# Patient Record
Sex: Female | Born: 1950 | Race: White | Hispanic: No | Marital: Single | Smoking: Never smoker
Health system: Southern US, Community
[De-identification: ages and names within clinical notes are randomized; demographics above are authoritative.]

## PROBLEM LIST (undated history)

## (undated) DIAGNOSIS — I10 Essential (primary) hypertension: Secondary | ICD-10-CM

## (undated) DIAGNOSIS — F329 Major depressive disorder, single episode, unspecified: Secondary | ICD-10-CM

## (undated) DIAGNOSIS — F32A Depression, unspecified: Secondary | ICD-10-CM

## (undated) DIAGNOSIS — F418 Other specified anxiety disorders: Secondary | ICD-10-CM

## (undated) DIAGNOSIS — F419 Anxiety disorder, unspecified: Secondary | ICD-10-CM

## (undated) DIAGNOSIS — I341 Nonrheumatic mitral (valve) prolapse: Secondary | ICD-10-CM

## (undated) DIAGNOSIS — E7800 Pure hypercholesterolemia, unspecified: Secondary | ICD-10-CM

## (undated) HISTORY — PX: BLADDER SUSPENSION: SHX72

---

## 2009-02-19 ENCOUNTER — Emergency Department (HOSPITAL_BASED_OUTPATIENT_CLINIC_OR_DEPARTMENT_OTHER): Admission: EM | Admit: 2009-02-19 | Discharge: 2009-02-19 | Payer: Self-pay | Admitting: Emergency Medicine

## 2009-05-28 ENCOUNTER — Ambulatory Visit: Payer: Self-pay | Admitting: Diagnostic Radiology

## 2009-05-28 ENCOUNTER — Emergency Department (HOSPITAL_BASED_OUTPATIENT_CLINIC_OR_DEPARTMENT_OTHER): Admission: EM | Admit: 2009-05-28 | Discharge: 2009-05-28 | Payer: Self-pay | Admitting: Emergency Medicine

## 2010-11-19 LAB — POCT CARDIAC MARKERS
CKMB, poc: <1 ng/mL — ABNORMAL LOW (ref 1.0–8.0)
Myoglobin, poc: 48.1 ng/mL (ref 12–200)
Troponin i, poc: <0.05 ng/mL (ref 0.00–0.09)
Troponin i, poc: <0.05 ng/mL (ref 0.00–0.09)

## 2010-11-19 LAB — BASIC METABOLIC PANEL
BUN: 11 mg/dL (ref 6–23)
CO2: 27 mEq/L (ref 19–32)
Calcium: 9.1 mg/dL (ref 8.4–10.5)
Chloride: 106 mEq/L (ref 96–112)
GFR calc Af Amer: >60 mL/min (ref >60)
Potassium: 3.7 mEq/L (ref 3.5–5.1)
Sodium: 140 mEq/L (ref 135–145)

## 2010-11-19 LAB — DIFFERENTIAL
Lymphocytes Relative: 39 % (ref 12–46)
Monocytes Absolute: 0.4 10*3/uL (ref 0.1–1.0)

## 2010-11-19 LAB — CBC
Hemoglobin: 13.3 g/dL (ref 12.0–15.0)
RBC: 4.23 MIL/uL (ref 3.87–5.11)
RDW: 11.8 % (ref 11.5–15.5)
WBC: 5.4 10*3/uL (ref 4.0–10.5)

## 2013-12-19 ENCOUNTER — Encounter (HOSPITAL_BASED_OUTPATIENT_CLINIC_OR_DEPARTMENT_OTHER): Payer: Self-pay | Admitting: Emergency Medicine

## 2013-12-19 ENCOUNTER — Emergency Department (HOSPITAL_BASED_OUTPATIENT_CLINIC_OR_DEPARTMENT_OTHER)
Admission: EM | Admit: 2013-12-19 | Discharge: 2013-12-19 | Disposition: A | Discharge status: Home / Self Care | Payer: 59 | Attending: Emergency Medicine | Admitting: Emergency Medicine

## 2013-12-19 ENCOUNTER — Emergency Department (HOSPITAL_BASED_OUTPATIENT_CLINIC_OR_DEPARTMENT_OTHER): Payer: 59

## 2013-12-19 ENCOUNTER — Other Ambulatory Visit: Payer: Self-pay

## 2013-12-19 DIAGNOSIS — R0602 Shortness of breath: Secondary | ICD-10-CM | POA: Insufficient documentation

## 2013-12-19 DIAGNOSIS — F329 Major depressive disorder, single episode, unspecified: Secondary | ICD-10-CM | POA: Insufficient documentation

## 2013-12-19 DIAGNOSIS — R0789 Other chest pain: Secondary | ICD-10-CM | POA: Insufficient documentation

## 2013-12-19 DIAGNOSIS — F411 Generalized anxiety disorder: Secondary | ICD-10-CM | POA: Insufficient documentation

## 2013-12-19 DIAGNOSIS — F3289 Other specified depressive episodes: Secondary | ICD-10-CM | POA: Insufficient documentation

## 2013-12-19 DIAGNOSIS — R002 Palpitations: Secondary | ICD-10-CM | POA: Insufficient documentation

## 2013-12-19 DIAGNOSIS — Z79899 Other long term (current) drug therapy: Secondary | ICD-10-CM | POA: Insufficient documentation

## 2013-12-19 DIAGNOSIS — R079 Chest pain, unspecified: Secondary | ICD-10-CM

## 2013-12-19 HISTORY — DX: Major depressive disorder, single episode, unspecified: F32.9

## 2013-12-19 HISTORY — DX: Depression, unspecified: F32.A

## 2013-12-19 HISTORY — DX: Anxiety disorder, unspecified: F41.9

## 2013-12-19 LAB — TROPONIN I: Troponin I: <0.3 ng/mL (ref <0.30)

## 2013-12-19 LAB — CBC WITH DIFFERENTIAL/PLATELET
BASOS ABS: 0 10*3/uL (ref 0.0–0.1)
Basophils Relative: 1 % (ref 0–1)
EOS PCT: 1 % (ref 0–5)
Eosinophils Absolute: 0.1 10*3/uL (ref 0.0–0.7)
HCT: 43.6 % (ref 36.0–46.0)
Hemoglobin: 14.9 g/dL (ref 12.0–15.0)
Lymphocytes Relative: 34 % (ref 12–46)
Lymphs Abs: 2.1 10*3/uL (ref 0.7–4.0)
MCH: 30.8 pg (ref 26.0–34.0)
MCHC: 34.2 g/dL (ref 30.0–36.0)
MCV: 90.3 fL (ref 78.0–100.0)
MONOS PCT: 10 % (ref 3–12)
Monocytes Absolute: 0.6 10*3/uL (ref 0.1–1.0)
NEUTROS PCT: 54 % (ref 43–77)
Neutro Abs: 3.4 10*3/uL (ref 1.7–7.7)
PLATELETS: 300 10*3/uL (ref 150–400)
RBC: 4.83 MIL/uL (ref 3.87–5.11)
RDW: 11.8 % (ref 11.5–15.5)
WBC: 6.2 10*3/uL (ref 4.0–10.5)

## 2013-12-19 LAB — BASIC METABOLIC PANEL
BUN: 18 mg/dL (ref 6–23)
CO2: 21 mEq/L (ref 19–32)
Calcium: 10.2 mg/dL (ref 8.4–10.5)
Chloride: 98 mEq/L (ref 96–112)
Creatinine, Ser: 1 mg/dL (ref 0.50–1.10)
GFR calc Af Amer: 68 mL/min — ABNORMAL LOW (ref >90)
GFR, EST NON AFRICAN AMERICAN: 59 mL/min — AB (ref >90)
Glucose, Bld: 102 mg/dL — ABNORMAL HIGH (ref 70–99)
POTASSIUM: 4 meq/L (ref 3.7–5.3)
SODIUM: 138 meq/L (ref 137–147)

## 2013-12-19 MED ORDER — ASPIRIN 81 MG PO CHEW
162.0000 mg | CHEWABLE_TABLET | Freq: Once | ORAL | Status: AC
  Administered 2013-12-19: 162 mg via ORAL
  Filled 2013-12-19: qty 2

## 2013-12-19 MED ORDER — ASPIRIN 81 MG PO CHEW: 324.0000 mg | CHEWABLE_TABLET | Freq: Every day | ORAL | Status: DC

## 2013-12-19 MED ORDER — LORAZEPAM 2 MG/ML IJ SOLN
1.0000 mg | Freq: Once | INTRAMUSCULAR | Status: DC
  Filled 2013-12-19: qty 1

## 2013-12-19 NOTE — ED Notes (Signed)
Pt to room 5 in w/c, pt is sweaty, resp rapid and shallow, crying, states "please help me!" pt encouraged to slow her resp, states 'I just lost my mama, and my daddy has alzheimer's, my daughter is in [place]" pt is able to slow her resp, states that she has recently tried to stop taking her klonopin, resulting in increased anxiety. Pt states her chest pain is in her central chest, feels like "tightness" and is intermittent in nature. Pt states that she feels lightheaded, "but that's getting better now". ekg in progress and iv access being obtained while pt being triaged.

## 2013-12-19 NOTE — Discharge Instructions (Signed)
We saw you in the ER for the chest pain/shortness of breath. °All of our cardiac workup is normal, including labs, EKG and chest X-RAY are normal. °We are not sure what is causing your discomfort, but we feel comfortable sending you home at this time. The workup in the ER is not complete, and you should follow up with your primary care doctor for further evaluation. ° ° °Chest Pain (Nonspecific) °It is often hard to give a specific diagnosis for the cause of chest pain. There is always a chance that your pain could be related to something serious, such as a heart attack or a blood clot in the lungs. You need to follow up with your caregiver for further evaluation. °CAUSES  °· Heartburn. °· Pneumonia or bronchitis. °· Anxiety or stress. °· Inflammation around your heart (pericarditis) or lung (pleuritis or pleurisy). °· A blood clot in the lung. °· A collapsed lung (pneumothorax). It can develop suddenly on its own (spontaneous pneumothorax) or from injury (trauma) to the chest. °· Shingles infection (herpes zoster virus). °The chest wall is composed of bones, muscles, and cartilage. Any of these can be the source of the pain. °· The bones can be bruised by injury. °· The muscles or cartilage can be strained by coughing or overwork. °· The cartilage can be affected by inflammation and become sore (costochondritis). °DIAGNOSIS  °Lab tests or other studies, such as X-rays, electrocardiography, stress testing, or cardiac imaging, may be needed to find the cause of your pain.  °TREATMENT  °· Treatment depends on what may be causing your chest pain. Treatment may include: °· Acid blockers for heartburn. °· Anti-inflammatory medicine. °· Pain medicine for inflammatory conditions. °· Antibiotics if an infection is present. °· You may be advised to change lifestyle habits. This includes stopping smoking and avoiding alcohol, caffeine, and chocolate. °· You may be advised to keep your head raised (elevated) when sleeping.  This reduces the chance of acid going backward from your stomach into your esophagus. °· Most of the time, nonspecific chest pain will improve within 2 to 3 days with rest and mild pain medicine. °HOME CARE INSTRUCTIONS  °· If antibiotics were prescribed, take your antibiotics as directed. Finish them even if you start to feel better. °· For the next few days, avoid physical activities that bring on chest pain. Continue physical activities as directed. °· Do not smoke. °· Avoid drinking alcohol. °· Only take over-the-counter or prescription medicine for pain, discomfort, or fever as directed by your caregiver. °· Follow your caregiver's suggestions for further testing if your chest pain does not go away. °· Keep any follow-up appointments you made. If you do not go to an appointment, you could develop lasting (chronic) problems with pain. If there is any problem keeping an appointment, you must call to reschedule. °SEEK MEDICAL CARE IF:  °· You think you are having problems from the medicine you are taking. Read your medicine instructions carefully. °· Your chest pain does not go away, even after treatment. °· You develop a rash with blisters on your chest. °SEEK IMMEDIATE MEDICAL CARE IF:  °· You have increased chest pain or pain that spreads to your arm, neck, jaw, back, or abdomen. °· You develop shortness of breath, an increasing cough, or you are coughing up blood. °· You have severe back or abdominal pain, feel nauseous, or vomit. °· You develop severe weakness, fainting, or chills. °· You have a fever. °THIS IS AN EMERGENCY. Do not wait to   see if the pain will go away. Get medical help at once. Call your local emergency services (911 in [place] Do not drive yourself to the hospital. °MAKE SURE YOU:  °· Understand these instructions. °· Will watch your condition. °· Will get help right away if you are not doing well or get worse. °Document Released: 05/12/2005 Document Revised: 10/25/2011 Document Reviewed:  03/07/2008 °[employer]® Patient Information ©2014 [employer]. ° °

## 2013-12-19 NOTE — ED Notes (Signed)
MD at bedside. 

## 2013-12-19 NOTE — ED Notes (Signed)
Pt states she does not have a ride home. Declines ativan at this time, states she will try to call her daughter to pick her up and let me know.

## 2013-12-19 NOTE — ED Provider Notes (Signed)
CSN: [id number]     Arrival date & time 12/19/13  [id number] History   First MD Initiated Contact with Patient 12/19/13 1019     Chief Complaint  Patient presents with  . Chest Pain  . Shortness of Breath     (Consider location/radiation/quality/duration/timing/severity/associated sxs/prior Treatment) HPI Comments: Pt comes in with cc of chest pain, dib. Pt has hx of anxiety, no medical hx, no cardiac disease. She reports having chest heaviness, anxiety, and dib. She has no nausea, diaphoresis. Pt reports slowly weaning herself off of klonopin.  Patient is a 63 y.o. female presenting with chest pain and shortness of breath. The history is provided by the patient.  Chest Pain Associated symptoms: palpitations and shortness of breath   Associated symptoms: no abdominal pain, no headache, no nausea and not vomiting   Shortness of Breath Associated symptoms: chest pain   Associated symptoms: no abdominal pain, no headaches, no neck pain and no vomiting     Past Medical History  Diagnosis Date  . Anxiety   . Depression    History reviewed. No pertinent past surgical history. History reviewed. No pertinent family history. History  Substance Use Topics  . Smoking status: Never Smoker   . Smokeless tobacco: Not on file  . Alcohol Use: Not on file   OB History   Grav Para Term Preterm Abortions TAB SAB Ect Mult Living                 Review of Systems  Constitutional: Negative for activity change.  Respiratory: Positive for shortness of breath.   Cardiovascular: Positive for chest pain and palpitations.  Gastrointestinal: Negative for nausea, vomiting and abdominal pain.  Genitourinary: Negative for dysuria.  Musculoskeletal: Negative for neck pain.  Neurological: Negative for headaches.      Allergies  Review of patient's allergies indicates no known allergies.  Home Medications   Prior to Admission medications   Medication Sig Start Date End Date Taking? Authorizing  Provider  clonazePAM (KLONOPIN) 1 MG tablet Take 1 mg by mouth 2 (two) times daily.   Yes Historical Provider, MD  PARoxetine (PAXIL) 40 MG tablet Take 40 mg by mouth every morning.   Yes Historical Provider, MD  aspirin 81 MG chewable tablet Chew 4 tablets (324 mg total) by mouth daily. 12/19/13   Theora Vankirk, MD   BP 132/69  Pulse 68  Temp(Src) 97.9 F (36.6 C) (Oral)  Resp 18  SpO2 99% Physical Exam  Nursing note and vitals reviewed. Constitutional: She is oriented to person, place, and time. She appears well-developed and well-nourished.  HENT:  Head: Normocephalic and atraumatic.  Eyes: EOM are normal. Pupils are equal, round, and reactive to light.  Neck: Neck supple.  Cardiovascular: Normal rate, regular rhythm and normal heart sounds.   No murmur heard. Pulmonary/Chest: Effort normal. No respiratory distress.  Abdominal: Soft. She exhibits no distension. There is no tenderness. There is no rebound and no guarding.  Neurological: She is alert and oriented to person, place, and time.  Skin: Skin is warm and dry.    ED Course  Procedures (including critical care time) Labs Review Labs Reviewed  BASIC METABOLIC PANEL - Abnormal; Notable for the following:    Glucose, Bld 102 (*)    GFR calc non Af Amer 59 (*)    GFR calc Af Amer 68 (*)    All other components within normal limits  CBC WITH DIFFERENTIAL  TROPONIN I  TROPONIN I    Imaging  Review Dg Chest 2 View  12/19/2013   CLINICAL DATA:  Chest tightness.  Shortness of breath.  EXAM: CHEST  2 VIEW  COMPARISON:  PA and lateral chest 05/28/2009.  FINDINGS: Lungs are clear. Heart size is normal. No pneumothorax or pleural effusion.  IMPRESSION: Negative chest.   Electronically Signed   By: Drusilla Kannerhomas  Dalessio M.D.   On: 12/19/2013 11:13     EKG Interpretation None      Date: 12/19/2013  Rate: 87  Rhythm: normal sinus rhythm  QRS Axis: normal  Intervals: normal  ST/T Wave abnormalities: normal  Conduction  Disutrbances: none  Narrative Interpretation: unremarkable     MDM   Final diagnoses:  Chest pain    Differential diagnosis includes: ACS syndrome CHF exacerbation Valvular disorder Myocarditis Pericarditis Pericardial effusion Pneumonia Pleural effusion Pulmonary edema PE Anemia Musculoskeletal pain Klonipin withdrawal  Pt comes in with cc of chest pain. Pt has tightness, with dib. Sx have been intermittent since last week. Pt has been weaning herself off of her klonopin. She has no CAD hx. Hx and exam suggestive of a little anxiety, or klonipin withdrawals.  I gave patient ativan, and she felt a lot better. EKG is normal. HEART score is 1.  I believe patient's sx are related to withdrawal based on extended monitoring in the ER. Pt advisedto see her pcp in 3-5 days, and start takingher klonipin as prescribed.   Derwood KaplanAnkit Linsy Ehresman, MD 12/19/13 1558

## 2014-11-25 ENCOUNTER — Emergency Department (HOSPITAL_BASED_OUTPATIENT_CLINIC_OR_DEPARTMENT_OTHER): Payer: BLUE CROSS/BLUE SHIELD

## 2014-11-25 ENCOUNTER — Emergency Department (HOSPITAL_BASED_OUTPATIENT_CLINIC_OR_DEPARTMENT_OTHER)
Admission: EM | Admit: 2014-11-25 | Discharge: 2014-11-25 | Disposition: A | Discharge status: Home / Self Care | Payer: BLUE CROSS/BLUE SHIELD | Attending: Emergency Medicine | Admitting: Emergency Medicine

## 2014-11-25 ENCOUNTER — Encounter (HOSPITAL_BASED_OUTPATIENT_CLINIC_OR_DEPARTMENT_OTHER): Payer: Self-pay | Admitting: *Deleted

## 2014-11-25 DIAGNOSIS — Z8679 Personal history of other diseases of the circulatory system: Secondary | ICD-10-CM | POA: Diagnosis not present

## 2014-11-25 DIAGNOSIS — Z79899 Other long term (current) drug therapy: Secondary | ICD-10-CM | POA: Diagnosis not present

## 2014-11-25 DIAGNOSIS — R0789 Other chest pain: Secondary | ICD-10-CM | POA: Insufficient documentation

## 2014-11-25 DIAGNOSIS — F419 Anxiety disorder, unspecified: Secondary | ICD-10-CM | POA: Insufficient documentation

## 2014-11-25 DIAGNOSIS — R51 Headache: Secondary | ICD-10-CM | POA: Insufficient documentation

## 2014-11-25 DIAGNOSIS — R519 Headache, unspecified: Secondary | ICD-10-CM

## 2014-11-25 DIAGNOSIS — R079 Chest pain, unspecified: Secondary | ICD-10-CM | POA: Diagnosis present

## 2014-11-25 DIAGNOSIS — F329 Major depressive disorder, single episode, unspecified: Secondary | ICD-10-CM | POA: Insufficient documentation

## 2014-11-25 DIAGNOSIS — Z8639 Personal history of other endocrine, nutritional and metabolic disease: Secondary | ICD-10-CM | POA: Diagnosis not present

## 2014-11-25 DIAGNOSIS — I10 Essential (primary) hypertension: Secondary | ICD-10-CM | POA: Insufficient documentation

## 2014-11-25 HISTORY — DX: Other specified anxiety disorders: F41.8

## 2014-11-25 HISTORY — DX: Pure hypercholesterolemia, unspecified: E78.00

## 2014-11-25 HISTORY — DX: Essential (primary) hypertension: I10

## 2014-11-25 HISTORY — DX: Nonrheumatic mitral (valve) prolapse: I34.1

## 2014-11-25 LAB — URINALYSIS, ROUTINE W REFLEX MICROSCOPIC
Bilirubin Urine: NEGATIVE
GLUCOSE, UA: NEGATIVE mg/dL
Hgb urine dipstick: NEGATIVE
KETONES UR: NEGATIVE mg/dL
Nitrite: NEGATIVE
PROTEIN: NEGATIVE mg/dL
SPECIFIC GRAVITY, URINE: 1.003 — AB (ref 1.005–1.030)
UROBILINOGEN UA: 0.2 mg/dL (ref 0.0–1.0)
pH: 6.5 (ref 5.0–8.0)

## 2014-11-25 LAB — URINE MICROSCOPIC-ADD ON

## 2014-11-25 LAB — COMPREHENSIVE METABOLIC PANEL
ALT: 23 U/L (ref 0–35)
AST: 21 U/L (ref 0–37)
Albumin: 4.4 g/dL (ref 3.5–5.2)
Alkaline Phosphatase: 70 U/L (ref 39–117)
Anion gap: 10 (ref 5–15)
BUN: 17 mg/dL (ref 6–23)
CO2: 24 mmol/L (ref 19–32)
Calcium: 9 mg/dL (ref 8.4–10.5)
Chloride: 102 mmol/L (ref 96–112)
Creatinine, Ser: 1.02 mg/dL (ref 0.50–1.10)
GFR calc non Af Amer: 57 mL/min — ABNORMAL LOW (ref >90)
GFR, EST AFRICAN AMERICAN: 66 mL/min — AB (ref >90)
Glucose, Bld: 90 mg/dL (ref 70–99)
Potassium: 3.3 mmol/L — ABNORMAL LOW (ref 3.5–5.1)
SODIUM: 136 mmol/L (ref 135–145)
TOTAL PROTEIN: 7.2 g/dL (ref 6.0–8.3)
Total Bilirubin: 0.4 mg/dL (ref 0.3–1.2)

## 2014-11-25 LAB — CBC WITH DIFFERENTIAL/PLATELET
BASOS PCT: 0 % (ref 0–1)
Basophils Absolute: 0 10*3/uL (ref 0.0–0.1)
Eosinophils Absolute: 0.2 10*3/uL (ref 0.0–0.7)
Eosinophils Relative: 2 % (ref 0–5)
HCT: 41.7 % (ref 36.0–46.0)
Hemoglobin: 13.8 g/dL (ref 12.0–15.0)
Lymphocytes Relative: 33 % (ref 12–46)
Lymphs Abs: 3.2 10*3/uL (ref 0.7–4.0)
MCH: 30.2 pg (ref 26.0–34.0)
MCHC: 33.1 g/dL (ref 30.0–36.0)
MCV: 91.2 fL (ref 78.0–100.0)
Monocytes Absolute: 0.9 10*3/uL (ref 0.1–1.0)
Monocytes Relative: 9 % (ref 3–12)
NEUTROS PCT: 56 % (ref 43–77)
Neutro Abs: 5.5 10*3/uL (ref 1.7–7.7)
PLATELETS: 352 10*3/uL (ref 150–400)
RBC: 4.57 MIL/uL (ref 3.87–5.11)
RDW: 12 % (ref 11.5–15.5)
WBC: 9.7 10*3/uL (ref 4.0–10.5)

## 2014-11-25 LAB — TROPONIN I

## 2014-11-25 MED ORDER — IBUPROFEN 800 MG PO TABS
800.0000 mg | ORAL_TABLET | Freq: Once | ORAL | Status: AC
  Administered 2014-11-25: 800 mg via ORAL
  Filled 2014-11-25: qty 1

## 2014-11-25 NOTE — ED Provider Notes (Signed)
CSN: [id number]     Arrival date & time 11/25/14  1211 History   First MD Initiated Contact with Patient 11/25/14 1241     Chief Complaint  Patient presents with  . Chest Pain     (Consider location/radiation/quality/duration/timing/severity/associated sxs/prior Treatment) HPI Comments: Patient is a 64 year old female with history of hypertension, depression, anxiety, and mitral valve prolapse. She presents for evaluation of chest discomfort under her left breast, feeling anxious, and experiencing a headache that started today while at work. She has had multiple episodes of this over the past several weeks. She's been seen 3 times at [facility], however no cause has been found. She was supposed to have a stress test performed today, however she did not make this appointment due to her employment.  Patient is a 64 y.o. female presenting with chest pain. The history is provided by the patient.  Chest Pain Pain location:  L chest Pain quality: sharp   Pain radiates to:  Does not radiate Pain radiates to the back: no   Pain severity:  Moderate Onset quality:  Sudden Timing:  Constant Progression:  Worsening Chronicity:  New Context: not breathing   Relieved by:  Nothing Worsened by:  Nothing tried   Past Medical History  Diagnosis Date  . Hypertension     treated w/ beta blocker  . Elevated cholesterol   . MVP (mitral valve prolapse)   . Depression with anxiety    Past Surgical History  Procedure Laterality Date  . Bladder suspension     No family history on file. History  Substance Use Topics  . Smoking status: Never Smoker   . Smokeless tobacco: Not on file  . Alcohol Use: No   OB History    No data available     Review of Systems  Cardiovascular: Positive for chest pain.  All other systems reviewed and are negative.     Allergies  Review of patient's allergies indicates no known allergies.  Home Medications   Prior to Admission medications    Medication Sig Start Date End Date Taking? Authorizing Provider  clonazePAM (KLONOPIN) 0.5 MG tablet Take 0.5 mg by mouth 3 (three) times daily as needed for anxiety.   Yes Historical Provider, MD  metoprolol succinate (TOPROL-XL) 25 MG 24 hr tablet Take 25 mg by mouth daily.   Yes Historical Provider, MD  PARoxetine (PAXIL) 40 MG tablet Take 60 mg by mouth every morning.   Yes Historical Provider, MD   BP 170/72 mmHg  Temp(Src) 98 F (36.7 C) (Oral)  Resp 20  Ht 5\' 7"  (1.702 m)  Wt 185 lb (83.915 kg)  BMI 28.97 kg/m2  SpO2 100% Physical Exam  Constitutional: She is oriented to person, place, and time. She appears well-developed and well-nourished. No distress.  HENT:  Head: Normocephalic and atraumatic.  Mouth/Throat: Oropharynx is clear and moist.  Eyes: EOM are normal. Pupils are equal, round, and reactive to light.  Neck: Normal range of motion. Neck supple.  Cardiovascular: Normal rate and regular rhythm.  Exam reveals no gallop and no friction rub.   No murmur heard. Pulmonary/Chest: Effort normal and breath sounds normal. No respiratory distress. She has no wheezes.  Abdominal: Soft. Bowel sounds are normal. She exhibits no distension. There is no tenderness.  Musculoskeletal: Normal range of motion.  Neurological: She is alert and oriented to person, place, and time. No cranial nerve deficit. She exhibits normal muscle tone. Coordination normal.  Skin: Skin is warm and dry. She  is not diaphoretic.  Nursing note and vitals reviewed.   ED Course  Procedures (including critical care time) Labs Review Labs Reviewed  COMPREHENSIVE METABOLIC PANEL - Abnormal; Notable for the following:    Potassium 3.3 (*)    GFR calc non Af Amer 57 (*)    GFR calc Af Amer 66 (*)    All other components within normal limits  URINALYSIS, ROUTINE W REFLEX MICROSCOPIC - Abnormal; Notable for the following:    Specific Gravity, Urine 1.003 (*)    Leukocytes, UA SMALL (*)    All other  components within normal limits  CBC WITH DIFFERENTIAL/PLATELET  TROPONIN I  URINE MICROSCOPIC-ADD ON    Imaging Review Dg Chest 2 View  11/25/2014   CLINICAL DATA:  Intermittent chest pain for 3 weeks, dizziness  EXAM: CHEST  2 VIEW  COMPARISON:  None.  FINDINGS: Cardiomediastinal silhouette is unremarkable. No acute infiltrate or pleural effusion. No pulmonary edema. Mild degenerative changes mid and lower thoracic spine.  IMPRESSION: No active cardiopulmonary disease. Mild degenerative changes thoracic spine.   Electronically Signed   By: Natasha Mead M.D.   On: 11/25/2014 13:10   Ct Head Wo Contrast  11/25/2014   CLINICAL DATA:  Patient states she was at work today and developed a sudden onset of pain under her left breast, which radiated to the right chest , up the right side of the body into her head.  EXAM: CT HEAD WITHOUT CONTRAST  TECHNIQUE: Contiguous axial images were obtained from the base of the skull through the vertex without intravenous contrast.  COMPARISON:  None.  FINDINGS: No acute intracranial hemorrhage. No focal mass lesion. No CT evidence of acute infarction. No midline shift or mass effect. No hydrocephalus. Basilar cisterns are patent.  Paranasal sinuses and  mastoid air cells are clear.  IMPRESSION: No acute intracranial findings.   Electronically Signed   By: Genevive Bi M.D.   On: 11/25/2014 13:55     EKG Interpretation   Date/Time:  Monday November 25 2014 12:21:52 EDT Ventricular Rate:  58 PR Interval:  132 QRS Duration: 82 QT Interval:  446 QTC Calculation: 437 R Axis:   46 Text Interpretation:  Sinus bradycardia Otherwise normal ECG Confirmed by  DELOS  MD, Velvet Moomaw ([id number]) on 11/25/2014 2:59:17 PM      MDM   Final diagnoses:  None    Patient presents with complaints of tightness in her chest, headache, numbness in her hands and feet that started suddenly at work. She has a history of anxiety, however states this feels different. Her cardiac workup is  negative and head CT is negative as well. Her laboratory studies are unremarkable. Nothing today appears emergent. I suspect that this is some sort of anxiety attack as nothing else is turned up out of the ordinary. She was supposed to have a stress test today, however she missed this could she had to work. I've advised her to call and reschedule. She will be discharged to home with instructions to return as needed if her symptoms worsen or change.    Geoffery Lyons, MD 11/25/14 [id number]

## 2014-11-25 NOTE — Discharge Instructions (Signed)
Follow-up with your cardiologist to reschedule your stress test.  Return to the ER if your symptoms significantly worsen or change.   Chest Pain (Nonspecific) It is often hard to give a specific diagnosis for the cause of chest pain. There is always a chance that your pain could be related to something serious, such as a heart attack or a blood clot in the lungs. You need to follow up with your health care provider for further evaluation. CAUSES   Heartburn.  Pneumonia or bronchitis.  Anxiety or stress.  Inflammation around your heart (pericarditis) or lung (pleuritis or pleurisy).  A blood clot in the lung.  A collapsed lung (pneumothorax). It can develop suddenly on its own (spontaneous pneumothorax) or from trauma to the chest.  Shingles infection (herpes zoster virus). The chest wall is composed of bones, muscles, and cartilage. Any of these can be the source of the pain.  The bones can be bruised by injury.  The muscles or cartilage can be strained by coughing or overwork.  The cartilage can be affected by inflammation and become sore (costochondritis). DIAGNOSIS  Lab tests or other studies may be needed to find the cause of your pain. Your health care provider may have you take a test called an ambulatory electrocardiogram (ECG). An ECG records your heartbeat patterns over a 24-hour period. You may also have other tests, such as:  Transthoracic echocardiogram (TTE). During echocardiography, sound waves are used to evaluate how blood flows through your heart.  Transesophageal echocardiogram (TEE).  Cardiac monitoring. This allows your health care provider to monitor your heart rate and rhythm in real time.  Holter monitor. This is a portable device that records your heartbeat and can help diagnose heart arrhythmias. It allows your health care provider to track your heart activity for several days, if needed.  Stress tests by exercise or by giving medicine that makes the  heart beat faster. TREATMENT   Treatment depends on what may be causing your chest pain. Treatment may include:  Acid blockers for heartburn.  Anti-inflammatory medicine.  Pain medicine for inflammatory conditions.  Antibiotics if an infection is present.  You may be advised to change lifestyle habits. This includes stopping smoking and avoiding alcohol, caffeine, and chocolate.  You may be advised to keep your head raised (elevated) when sleeping. This reduces the chance of acid going backward from your stomach into your esophagus. Most of the time, nonspecific chest pain will improve within 2-3 days with rest and mild pain medicine.  HOME CARE INSTRUCTIONS   If antibiotics were prescribed, take them as directed. Finish them even if you start to feel better.  For the next few days, avoid physical activities that bring on chest pain. Continue physical activities as directed.  Do not use any tobacco products, including cigarettes, chewing tobacco, or electronic cigarettes.  Avoid drinking alcohol.  Only take medicine as directed by your health care provider.  Follow your health care provider's suggestions for further testing if your chest pain does not go away.  Keep any follow-up appointments you made. If you do not go to an appointment, you could develop lasting (chronic) problems with pain. If there is any problem keeping an appointment, call to reschedule. SEEK MEDICAL CARE IF:   Your chest pain does not go away, even after treatment.  You have a rash with blisters on your chest.  You have a fever. SEEK IMMEDIATE MEDICAL CARE IF:   You have increased chest pain or pain that spreads  to your arm, neck, jaw, back, or abdomen.  You have shortness of breath.  You have an increasing cough, or you cough up blood.  You have severe back or abdominal pain.  You feel nauseous or vomit.  You have severe weakness.  You faint.  You have chills. This is an emergency. Do  not wait to see if the pain will go away. Get medical help at once. Call your local emergency services (911 in [place] Do not drive yourself to the hospital. MAKE SURE YOU:   Understand these instructions.  Will watch your condition.  Will get help right away if you are not doing well or get worse. Document Released: 05/12/2005 Document Revised: 08/07/2013 Document Reviewed: 03/07/2008 [facility] Patient Information 2015 [place], [place]. This information is not intended to replace advice given to you by your health care provider. Make sure you discuss any questions you have with your health care provider.

## 2014-11-25 NOTE — ED Notes (Addendum)
Patient states she was at work today and developed a sudden onset of pain under her left breast, which radiated to the right chest , up the right side of the body into her head.  States she took 0.5 mg of klonopin with minimal relief. States her symptoms are associated with headaches as well.  Patient states she has had multiple episodes of the same symptoms for the last three weeks and has [facility] for the same.

## 2014-11-26 ENCOUNTER — Encounter (HOSPITAL_BASED_OUTPATIENT_CLINIC_OR_DEPARTMENT_OTHER): Payer: Self-pay | Admitting: Emergency Medicine

## 2015-12-09 ENCOUNTER — Emergency Department (HOSPITAL_BASED_OUTPATIENT_CLINIC_OR_DEPARTMENT_OTHER): Payer: Managed Care, Other (non HMO)

## 2015-12-09 ENCOUNTER — Emergency Department (HOSPITAL_BASED_OUTPATIENT_CLINIC_OR_DEPARTMENT_OTHER)
Admission: EM | Admit: 2015-12-09 | Discharge: 2015-12-09 | Disposition: A | Discharge status: Home / Self Care | Payer: Managed Care, Other (non HMO) | Attending: Emergency Medicine | Admitting: Emergency Medicine

## 2015-12-09 ENCOUNTER — Encounter (HOSPITAL_BASED_OUTPATIENT_CLINIC_OR_DEPARTMENT_OTHER): Payer: Self-pay | Admitting: *Deleted

## 2015-12-09 DIAGNOSIS — K580 Irritable bowel syndrome with diarrhea: Secondary | ICD-10-CM | POA: Diagnosis not present

## 2015-12-09 DIAGNOSIS — K5900 Constipation, unspecified: Secondary | ICD-10-CM | POA: Insufficient documentation

## 2015-12-09 DIAGNOSIS — N898 Other specified noninflammatory disorders of vagina: Secondary | ICD-10-CM | POA: Diagnosis not present

## 2015-12-09 DIAGNOSIS — M545 Low back pain: Secondary | ICD-10-CM

## 2015-12-09 DIAGNOSIS — I10 Essential (primary) hypertension: Secondary | ICD-10-CM | POA: Insufficient documentation

## 2015-12-09 DIAGNOSIS — Z87440 Personal history of urinary (tract) infections: Secondary | ICD-10-CM | POA: Insufficient documentation

## 2015-12-09 DIAGNOSIS — Z7982 Long term (current) use of aspirin: Secondary | ICD-10-CM | POA: Insufficient documentation

## 2015-12-09 DIAGNOSIS — Z79899 Other long term (current) drug therapy: Secondary | ICD-10-CM | POA: Diagnosis not present

## 2015-12-09 DIAGNOSIS — F418 Other specified anxiety disorders: Secondary | ICD-10-CM | POA: Insufficient documentation

## 2015-12-09 DIAGNOSIS — Z8639 Personal history of other endocrine, nutritional and metabolic disease: Secondary | ICD-10-CM | POA: Diagnosis not present

## 2015-12-09 DIAGNOSIS — K589 Irritable bowel syndrome without diarrhea: Secondary | ICD-10-CM

## 2015-12-09 DIAGNOSIS — R1030 Lower abdominal pain, unspecified: Secondary | ICD-10-CM | POA: Diagnosis present

## 2015-12-09 LAB — CBC WITH DIFFERENTIAL/PLATELET
Basophils Absolute: 0 10*3/uL (ref 0.0–0.1)
Basophils Relative: 0 %
EOS ABS: 0.2 10*3/uL (ref 0.0–0.7)
Eosinophils Relative: 2 %
HEMATOCRIT: 39 % (ref 36.0–46.0)
HEMOGLOBIN: 13.4 g/dL (ref 12.0–15.0)
LYMPHS ABS: 1.8 10*3/uL (ref 0.7–4.0)
Lymphocytes Relative: 28 %
MCH: 31.2 pg (ref 26.0–34.0)
MCHC: 34.4 g/dL (ref 30.0–36.0)
MCV: 90.7 fL (ref 78.0–100.0)
MONO ABS: 0.6 10*3/uL (ref 0.1–1.0)
MONOS PCT: 9 %
NEUTROS ABS: 4.1 10*3/uL (ref 1.7–7.7)
NEUTROS PCT: 61 %
Platelets: 286 10*3/uL (ref 150–400)
RBC: 4.3 MIL/uL (ref 3.87–5.11)
RDW: 12.3 % (ref 11.5–15.5)
WBC: 6.7 10*3/uL (ref 4.0–10.5)

## 2015-12-09 LAB — URINE MICROSCOPIC-ADD ON

## 2015-12-09 LAB — COMPREHENSIVE METABOLIC PANEL
ALK PHOS: 64 U/L (ref 38–126)
ALT: 26 U/L (ref 14–54)
ANION GAP: 10 (ref 5–15)
AST: 22 U/L (ref 15–41)
Albumin: 4.5 g/dL (ref 3.5–5.0)
BILIRUBIN TOTAL: 0.8 mg/dL (ref 0.3–1.2)
BUN: 25 mg/dL — ABNORMAL HIGH (ref 6–20)
CALCIUM: 9 mg/dL (ref 8.9–10.3)
CO2: 22 mmol/L (ref 22–32)
Chloride: 98 mmol/L — ABNORMAL LOW (ref 101–111)
Creatinine, Ser: 1.16 mg/dL — ABNORMAL HIGH (ref 0.44–1.00)
GFR calc non Af Amer: 49 mL/min — ABNORMAL LOW (ref >60)
GFR, EST AFRICAN AMERICAN: 56 mL/min — AB (ref >60)
Glucose, Bld: 131 mg/dL — ABNORMAL HIGH (ref 65–99)
Potassium: 3.7 mmol/L (ref 3.5–5.1)
Sodium: 130 mmol/L — ABNORMAL LOW (ref 135–145)
TOTAL PROTEIN: 7.2 g/dL (ref 6.5–8.1)

## 2015-12-09 LAB — URINALYSIS, ROUTINE W REFLEX MICROSCOPIC
Bilirubin Urine: NEGATIVE
GLUCOSE, UA: NEGATIVE mg/dL
Hgb urine dipstick: NEGATIVE
KETONES UR: NEGATIVE mg/dL
NITRITE: NEGATIVE
PROTEIN: NEGATIVE mg/dL
Specific Gravity, Urine: 1.005 (ref 1.005–1.030)
pH: 6 (ref 5.0–8.0)

## 2015-12-09 MED ORDER — ACETAMINOPHEN 325 MG PO TABS
650.0000 mg | ORAL_TABLET | Freq: Once | ORAL | Status: AC
  Administered 2015-12-09: 650 mg via ORAL
  Filled 2015-12-09: qty 2

## 2015-12-09 NOTE — ED Notes (Signed)
Back pain sudden onset this am at work. She was seen by her last week for lower abdominal pain. She was started on Macrobid. No relief.

## 2015-12-09 NOTE — ED Provider Notes (Addendum)
CSN: [id number]     Arrival date & time 12/09/15  1103 History   First MD Initiated Contact with Patient 12/09/15 1148     Chief Complaint  Patient presents with  . Abdominal Pain  . Back Pain     (Consider location/radiation/quality/duration/timing/severity/associated sxs/prior Treatment) Patient is a 65 y.o. female presenting with abdominal pain and back pain. The history is provided by the patient.  Abdominal Pain Pain location:  Suprapubic Pain quality: sharp, shooting and stabbing   Pain radiates to:  Back Pain severity:  Severe Onset quality:  Sudden Duration:  4 hours Timing:  Constant Progression:  Improving Chronicity:  Recurrent Context comment:  Patient states for months now she's had intermittent low back pain that she gets weekly that usually start spontaneously Relieved by:  None tried Worsened by:  Nothing tried Ineffective treatments:  None tried Associated symptoms: constipation, diarrhea and vaginal discharge   Associated symptoms: no anorexia, no dysuria, no fever, no melena, no nausea, no shortness of breath and no vomiting   Associated symptoms comment:  Chronic GI issues with intermittent constipation and diarrhea that she takes MiraLAX for.  [facility] and started on Macrobid for a urinary tract infection. Yesterday developed vaginal itching and mild discharge. Currently no sexual activity in some time Risk factors: NSAID use   Risk factors: no alcohol abuse, has not had multiple surgeries and no recent hospitalization   Back Pain Associated symptoms: abdominal pain   Associated symptoms: no dysuria and no fever     Past Medical History  Diagnosis Date  . Anxiety   . Depression   . Hypertension     treated w/ beta blocker  . Elevated cholesterol   . MVP (mitral valve prolapse)   . Depression with anxiety    Past Surgical History  Procedure Laterality Date  . Bladder suspension     No family history on file. Social History   Substance Use Topics  . Smoking status: Never Smoker   . Smokeless tobacco: None  . Alcohol Use: No   OB History    Gravida Para Term Preterm AB TAB SAB Ectopic Multiple Living   0 0 0 0 0 0 0 0       Review of Systems  Constitutional: Negative for fever.  Respiratory: Negative for shortness of breath.   Gastrointestinal: Positive for abdominal pain, diarrhea, constipation and abdominal distention. Negative for nausea, vomiting, melena and anorexia.  Genitourinary: Positive for vaginal discharge. Negative for dysuria.  Musculoskeletal: Positive for back pain.  All other systems reviewed and are negative.     Allergies  Review of patient's allergies indicates no known allergies.  Home Medications   Prior to Admission medications   Medication Sig Start Date End Date Taking? Authorizing Provider  aspirin 81 MG chewable tablet Chew 4 tablets (324 mg total) by mouth daily. 12/19/13   Derwood Kaplan, MD  clonazePAM (KLONOPIN) 0.5 MG tablet Take 0.5 mg by mouth 3 (three) times daily as needed for anxiety.    Historical Provider, MD  clonazePAM (KLONOPIN) 1 MG tablet Take 1 mg by mouth 2 (two) times daily.    Historical Provider, MD  metoprolol succinate (TOPROL-XL) 25 MG 24 hr tablet Take 25 mg by mouth daily.    Historical Provider, MD  PARoxetine (PAXIL) 40 MG tablet Take 40 mg by mouth every morning.    Historical Provider, MD  PARoxetine (PAXIL) 40 MG tablet Take 60 mg by mouth every morning.  Historical Provider, MD   BP 141/79 mmHg  Pulse 52  Temp(Src) 97.8 F (36.6 C) (Oral)  Resp 20  Ht  (1.702 m)  Wt 185 lb (83.915 kg)  BMI 28.97 kg/m2  SpO2 99% Physical Exam  Constitutional: She is oriented to person, place, and time. She appears well-developed and well-nourished. No distress.  HENT:  Head: Normocephalic and atraumatic.  Mouth/Throat: Oropharynx is clear and moist.  Eyes: Conjunctivae and EOM are normal. Pupils are equal, round, and reactive to light.   Neck: Normal range of motion. Neck supple.  Cardiovascular: Normal rate, regular rhythm and intact distal pulses.   No murmur heard. Pulmonary/Chest: Effort normal and breath sounds normal. No respiratory distress. She has no wheezes. She has no rales.  Abdominal: Soft. Bowel sounds are normal. She exhibits no distension. There is tenderness in the suprapubic area. There is no rebound, no guarding and no CVA tenderness.  Musculoskeletal: Normal range of motion. She exhibits no edema.       Lumbar back: She exhibits tenderness. She exhibits no bony tenderness and no spasm.       Back:  Neurological: She is alert and oriented to person, place, and time.  Skin: Skin is warm and dry. No rash noted. No erythema.  Psychiatric: She has a normal mood and affect. Her behavior is normal.  Nursing note and vitals reviewed.   ED Course  Procedures (including critical care time) Labs Review Labs Reviewed  URINALYSIS, ROUTINE W REFLEX MICROSCOPIC (NOT AT [facility]) - Abnormal; Notable for the following:    Leukocytes, UA SMALL (*)    All other components within normal limits  URINE MICROSCOPIC-ADD ON - Abnormal; Notable for the following:    Squamous Epithelial / LPF 0-5 (*)    Bacteria, UA RARE (*)    All other components within normal limits  COMPREHENSIVE METABOLIC PANEL - Abnormal; Notable for the following:    Sodium 130 (*)    Chloride 98 (*)    Glucose, Bld 131 (*)    BUN 25 (*)    Creatinine, Ser 1.16 (*)    GFR calc non Af Amer 49 (*)    GFR calc Af Amer 56 (*)    All other components within normal limits  CBC WITH DIFFERENTIAL/PLATELET    Imaging Review US Renal  12/09/2015  CLINICAL DATA:  Bilateral flank pain for 2 years EXAM: RENAL / URINARY TRACT ULTRASOUND COMPLETE COMPARISON:  None. FINDINGS: Right Kidney: Length: 10.6 cm. Echogenicity within normal limits. No mass or hydronephrosis visualized. Left Kidney: Length: 10.0 cm. Echogenicity within normal limits. No mass or  hydronephrosis visualized. Bladder: Appears normal for degree of bladder distention. IMPRESSION: 1. Normal renal sonogram. Electronically Signed   By: Signa Kell M.D.   On: 12/09/2015 13:07   I have personally reviewed and evaluated these images and lab results as part of my medical decision-making.   EKG Interpretation None      MDM   Final diagnoses:  Bilateral low back pain, with sciatica presence unspecified  IBS (irritable bowel syndrome)    Patient is a 65 year old female presenting today with sudden onset of severe back pain that has been intermittent for some time. Upon arrival here the pain had improved but she still continues to have mild bilateral lower back pain. Patient was treated for urinary tract infection last week and continues to take Macrobid twice a day. She denies any dysuria but on exam has some mild suprapubic tenderness. Since being on the  antibiotic she has noticed some itching and burning with some vaginal discharge in the last 24 hours which is most likely a yeast infection. Patient has not been sexually active for some time.  The pain does not cause nausea or vomiting and she denies any fever. She struggles with intermittent diarrhea and constipation regularly.  On exam mild reproducible lumbar tenderness but otherwise normal exam. UA is consistent with small leukocytes but no white blood cells and rare bacteria with a low suspicion for infection. CBC, CMP pending. Concern for kidney stones as the cause of her pain versus musculoskeletal. No symptoms to suggest gallbladder disease. Low suspicion for pyelonephritis.  1:39 PM Renal ultrasound is within normal limits. CMP with mild acute kidney injury with a creatinine of 1.16 and a GFR of 49. CBC within normal limits. Unclear etiology for patient's pain however this time recommend NSAIDs and Tylenol and follow-up with her PCP.  Will treat for yeast infection.  Gwyneth SproutWhitney Venora Kautzman, MD 12/09/15 1340  Gwyneth SproutWhitney  Ashawn Rinehart, MD 12/09/15 1341  Gwyneth SproutWhitney Veta Dambrosia, MD 12/09/15 1406

## 2016-07-05 IMAGING — US US RENAL
1 series · 14 of 25 positions shown · non-contrast
Comparison: None.

CLINICAL DATA: Bilateral flank pain for 2 years

EXAM:
RENAL / URINARY TRACT ULTRASOUND COMPLETE

[Series 1: us renal · 0.18mm/px · 14 of 27 slices shown]
[im 1/27]
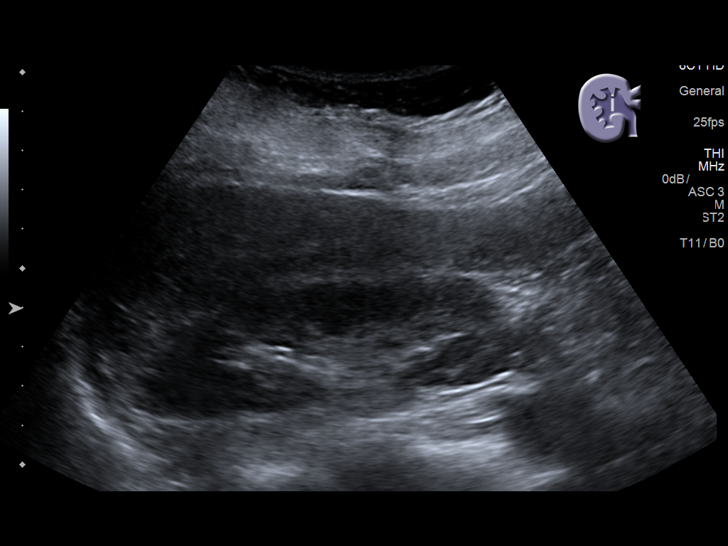
[im 3/27]
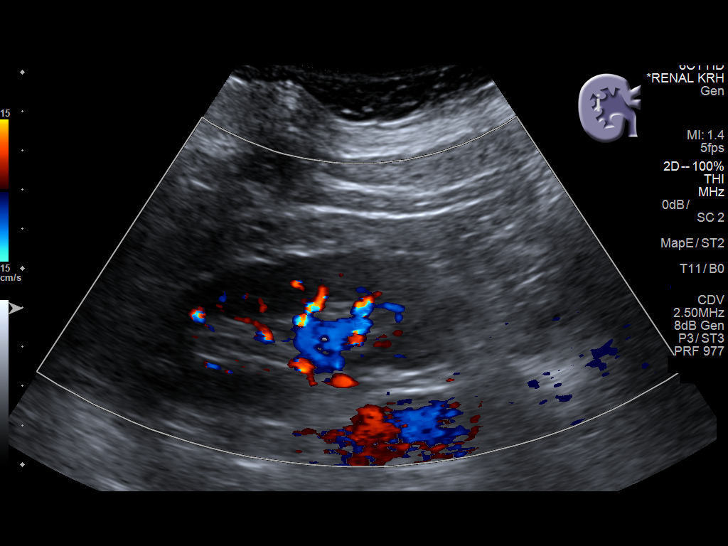
[im 5/27]
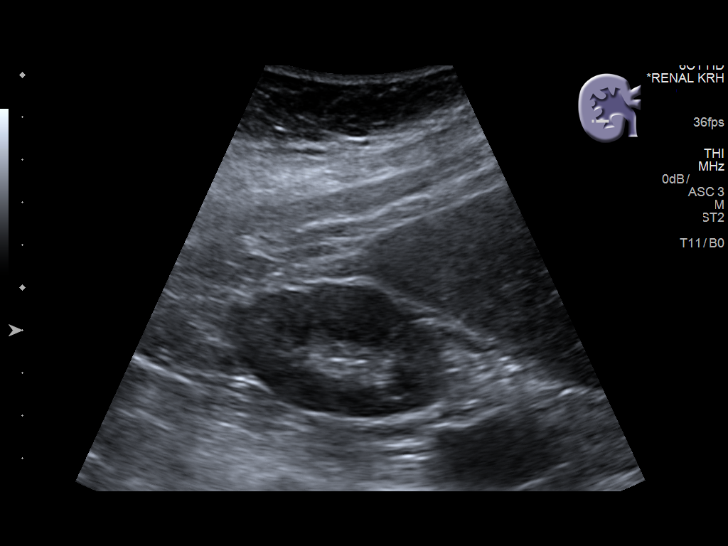
[im 7/27]
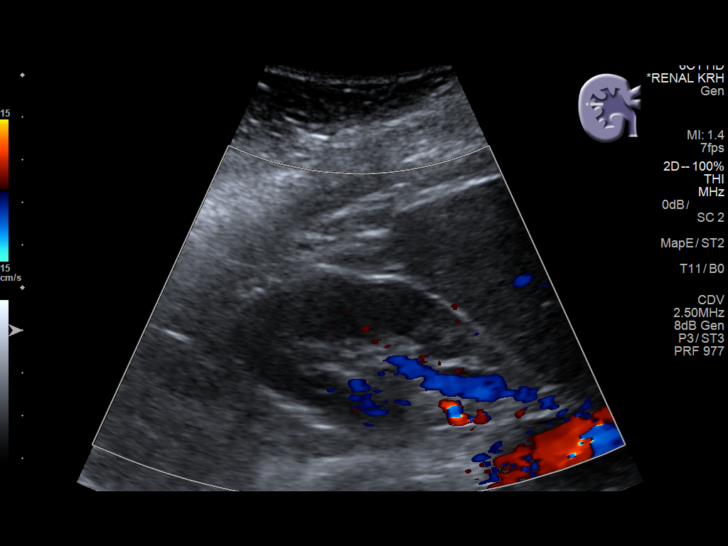
[im 9/27]
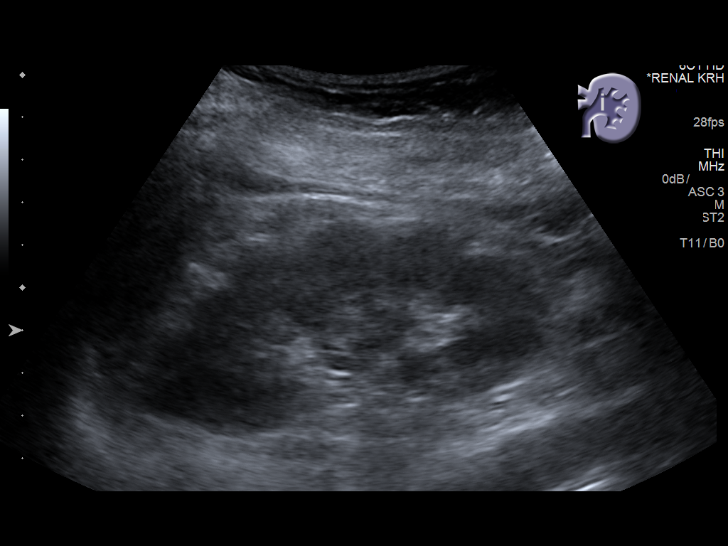
[im 10/27]
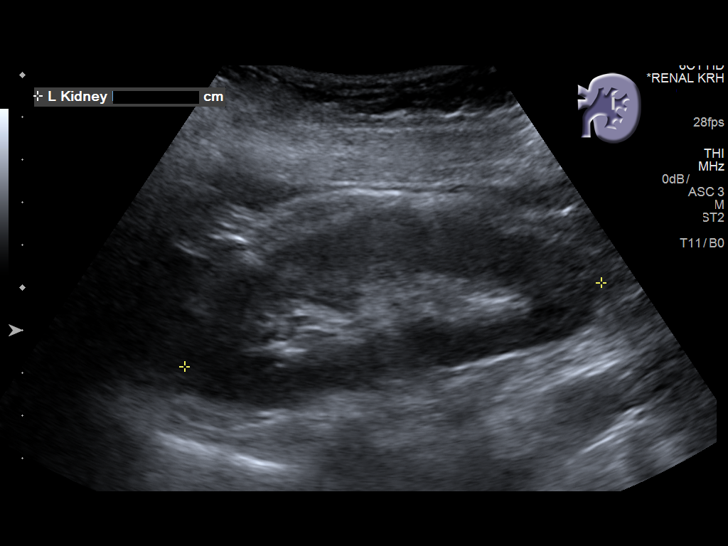
[im 12/27]
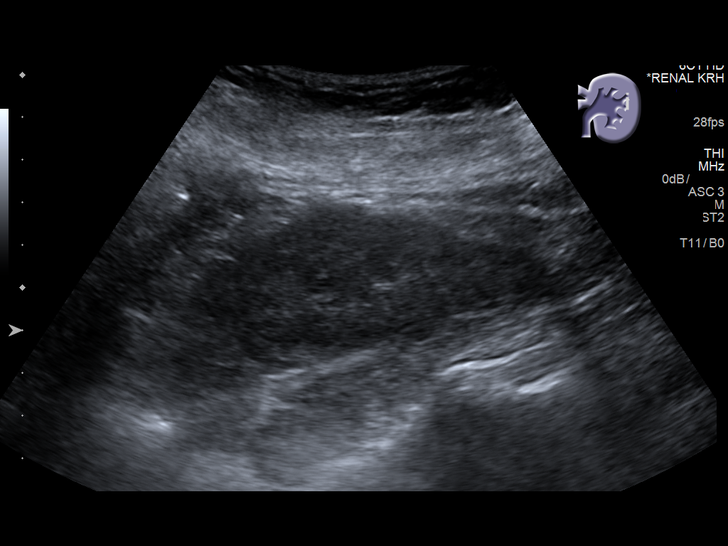
[im 15/27]
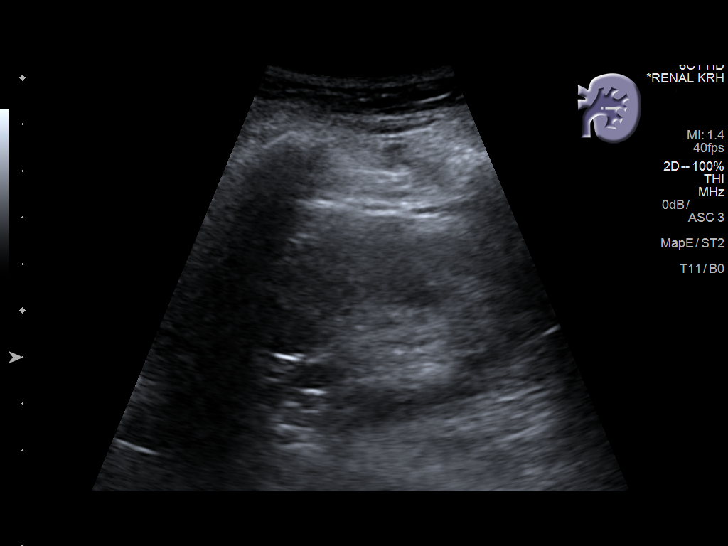
[im 17/27]
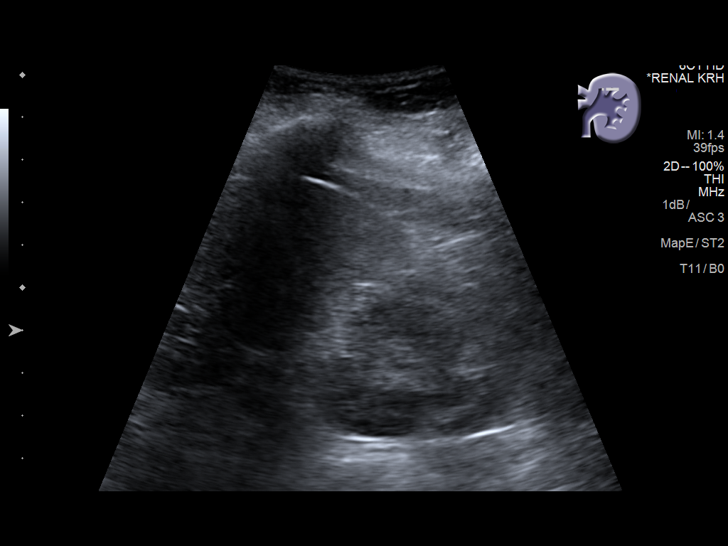
[im 18/27]
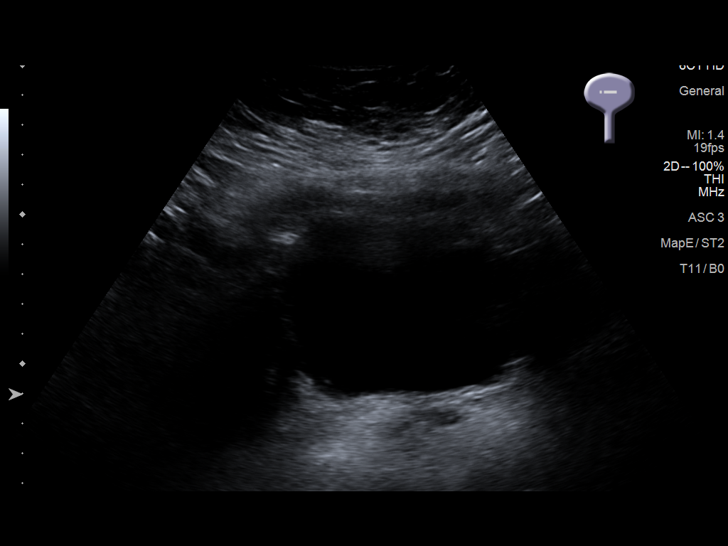
[im 20/27]
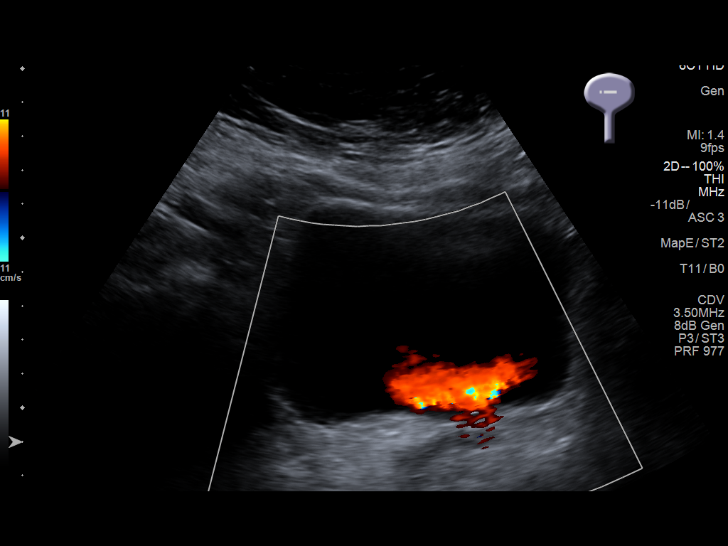
[im 22/27]
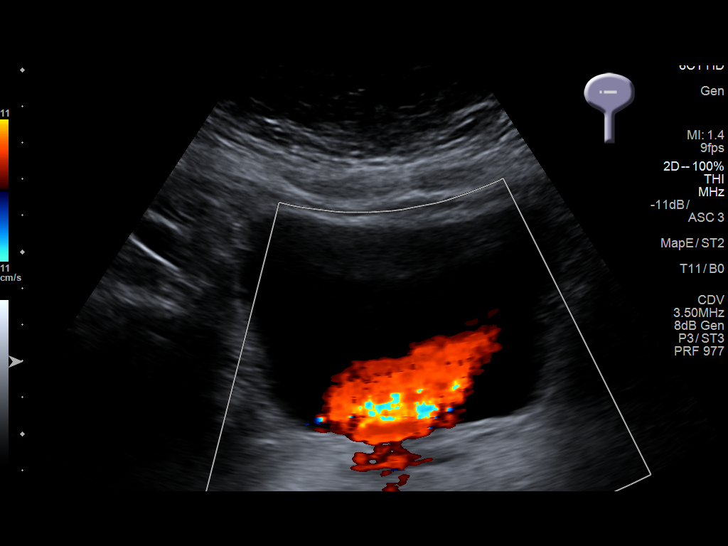
[im 24/27]
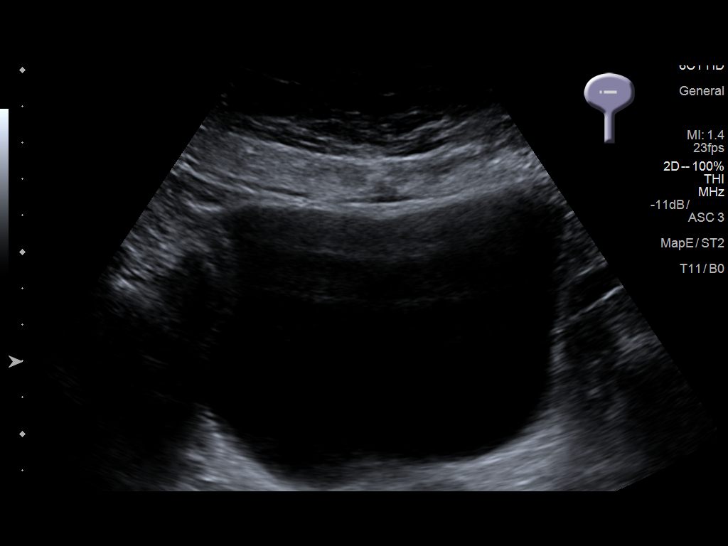
[im 27/27]
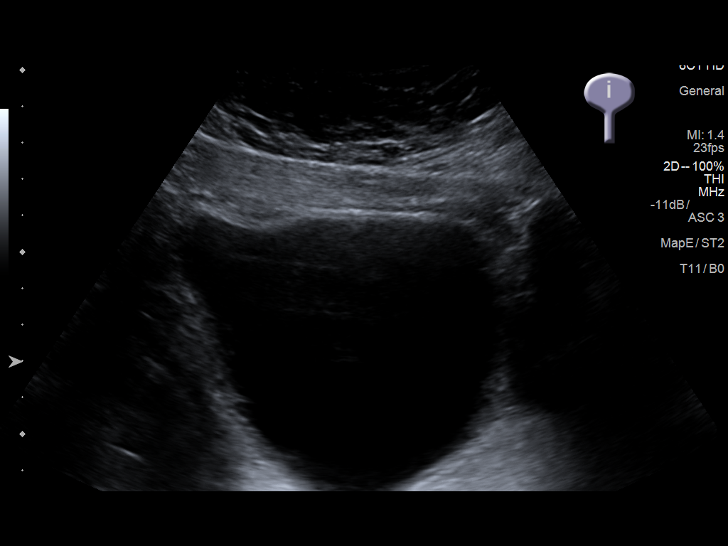

[14 of 25 positions shown; findings below may reference images not displayed]

FINDINGS: Right Kidney:

Length: 10.6 cm. Echogenicity within normal limits. No mass or
hydronephrosis visualized.

Left Kidney:

Length: 10.0 cm. Echogenicity within normal limits. No mass or
hydronephrosis visualized.

Bladder:

Appears normal for degree of bladder distention.
IMPRESSION: 1. Normal renal sonogram.

## 2021-02-19 DIAGNOSIS — Z79899 Other long term (current) drug therapy: Secondary | ICD-10-CM | POA: Diagnosis not present

## 2021-02-19 DIAGNOSIS — I10 Essential (primary) hypertension: Secondary | ICD-10-CM | POA: Diagnosis not present

## 2021-02-19 DIAGNOSIS — F411 Generalized anxiety disorder: Secondary | ICD-10-CM | POA: Diagnosis not present

## 2021-02-19 DIAGNOSIS — R7301 Impaired fasting glucose: Secondary | ICD-10-CM | POA: Diagnosis not present

## 2021-02-19 DIAGNOSIS — Z0000 Encounter for general adult medical examination without abnormal findings: Secondary | ICD-10-CM | POA: Diagnosis not present

## 2021-02-19 DIAGNOSIS — I2510 Atherosclerotic heart disease of native coronary artery without angina pectoris: Secondary | ICD-10-CM | POA: Diagnosis not present

## 2021-02-19 DIAGNOSIS — E782 Mixed hyperlipidemia: Secondary | ICD-10-CM | POA: Diagnosis not present

## 2021-02-19 DIAGNOSIS — F3181 Bipolar II disorder: Secondary | ICD-10-CM | POA: Diagnosis not present

## 2021-02-19 DIAGNOSIS — I340 Nonrheumatic mitral (valve) insufficiency: Secondary | ICD-10-CM | POA: Diagnosis not present

## 2021-02-19 DIAGNOSIS — F5105 Insomnia due to other mental disorder: Secondary | ICD-10-CM | POA: Diagnosis not present

## 2021-02-23 DIAGNOSIS — H5203 Hypermetropia, bilateral: Secondary | ICD-10-CM | POA: Diagnosis not present

## 2021-03-02 ENCOUNTER — Emergency Department
Admission: EM | Admit: 2021-03-02 | Discharge: 2021-03-02 | Disposition: A | Discharge status: Home / Self Care | Payer: Medicare HMO | Source: Home / Self Care | Attending: Family Medicine | Admitting: Family Medicine

## 2021-03-02 ENCOUNTER — Encounter: Payer: Self-pay | Admitting: Emergency Medicine

## 2021-03-02 ENCOUNTER — Other Ambulatory Visit: Payer: Self-pay

## 2021-03-02 DIAGNOSIS — B9789 Other viral agents as the cause of diseases classified elsewhere: Secondary | ICD-10-CM | POA: Diagnosis not present

## 2021-03-02 DIAGNOSIS — J028 Acute pharyngitis due to other specified organisms: Secondary | ICD-10-CM | POA: Diagnosis not present

## 2021-03-02 NOTE — ED Triage Notes (Addendum)
Patient presents to Urgent Care with complaints of sore throat and cough since this morning. Patient reports sore throat, cough.  Denies any fever or chills. Patient states she had left over Augmentin 875-125 mg (1 tab) and Prednisone 20 mg (1 tab). No known sick contacts. Fully covid vaccinated

## 2021-03-02 NOTE — Discharge Instructions (Addendum)
This looks like a sore throat from a cold virus We are doing COVID testing just to be safe For your sore throat you can use salt water gargles, or take Tylenol Do not take the Augmentin or the prednisone at this time I expect to see improvement over the next few days

## 2021-03-03 NOTE — ED Provider Notes (Signed)
[facility]    CSN: [id number] Arrival date & time: 03/02/21  1812      History   Chief Complaint Chief Complaint  Patient presents with   Cough    HPI Teresa Brewer is a 70 y.o. female.   HPI  Patient had a sore throat and some mild nasal congestion that started today.  She is here because she has anxiety about having infections.  No known exposure to strep or COVID.  Has not had COVID testing.  No fever or chills.  No headache or body ache.  No nausea or vomiting.  Past Medical History:  Diagnosis Date   Anxiety    Depression    Depression with anxiety    Elevated cholesterol    Hypertension    treated w/ beta blocker   MVP (mitral valve prolapse)     There are no problems to display for this patient.   Past Surgical History:  Procedure Laterality Date   BLADDER SUSPENSION      OB History     Gravida  0   Para  0   Term  0   Preterm  0   AB  0   Living         SAB  0   IAB  0   Ectopic  0   Multiple      Live Births               Home Medications    Prior to Admission medications   Medication Sig Start Date End Date Taking? Authorizing Provider  aspirin 81 MG chewable tablet Chew 4 tablets (324 mg total) by mouth daily. 12/19/13  Yes Nanavati, Ankit, MD  clonazePAM (KLONOPIN) 1 MG tablet Take 1 mg by mouth 2 (two) times daily.   Yes [provider]  metoprolol succinate (TOPROL-XL) 25 MG 24 hr tablet Take 25 mg by mouth daily.   Yes [provider]  PARoxetine (PAXIL) 40 MG tablet Take 40 mg by mouth every morning.   Yes [provider]    Family History History reviewed. No pertinent family history.  Social History Social History   Tobacco Use   Smoking status: Never   Smokeless tobacco: Never  Substance Use Topics   Alcohol use: No   Drug use: No     Allergies   Patient has no known allergies.   Review of Systems Review of Systems  See HPI Physical Exam Triage Vital  Signs ED Triage Vitals  Enc Vitals Group     BP 03/02/21 1926 132/82     Pulse Rate 03/02/21 1926 62     Resp 03/02/21 1926 16     Temp 03/02/21 1926 98.5 F (36.9 C)     Temp Source 03/02/21 1926 Oral     SpO2 03/02/21 1926 98 %     Weight --      Height --      Head Circumference --      Peak Flow --      Pain Score 03/02/21 1924 1     Pain Loc --      Pain Edu? --      Excl. in GC? --    No data found.  Updated Vital Signs BP 132/82 (BP Location: Right Arm)   Pulse 62   Temp 98.5 F (36.9 C) (Oral)   Resp 16   SpO2 98%       Physical Exam Constitutional:  General: She is not in acute distress.    Appearance: She is well-developed.  HENT:     Head: Normocephalic and atraumatic.     Right Ear: Tympanic membrane and ear canal normal.     Left Ear: Tympanic membrane and ear canal normal.     Nose: Congestion present.     Comments: Clear rhinorrhea    Mouth/Throat:     Pharynx: Posterior oropharyngeal erythema present.     Comments: Patient has scattered aphthous ulcers on her posterior pharynx and left tonsillar pillar on an erythematous base.  No exudate. Eyes:     Conjunctiva/sclera: Conjunctivae normal.     Pupils: Pupils are equal, round, and reactive to light.  Cardiovascular:     Rate and Rhythm: Normal rate and regular rhythm.     Heart sounds: Normal heart sounds.  Pulmonary:     Effort: Pulmonary effort is normal. No respiratory distress.     Breath sounds: Normal breath sounds.  Abdominal:     General: There is no distension.     Palpations: Abdomen is soft.  Musculoskeletal:        General: Normal range of motion.     Cervical back: Normal range of motion.  Skin:    General: Skin is warm and dry.  Neurological:     Mental Status: She is alert.     UC Treatments / Results  Labs (all labs ordered are listed, but only abnormal results are displayed) Labs Reviewed  NOVEL CORONAVIRUS, NAA    EKG   Radiology No results  found.  Procedures Procedures (including critical care time)  Medications Ordered in UC Medications - No data to display  Initial Impression / Assessment and Plan / UC Course  I have reviewed the triage vital signs and the nursing notes.  Pertinent labs & imaging results that were available during my care of the patient were reviewed by me and considered in my medical decision making (see chart for details).     Explained to the patient that this is a sore throat that is caused by a virus.  Not COVID.  Not strep.  Discussed symptomatic care Final Clinical Impressions(s) / UC Diagnoses   Final diagnoses:  Sore throat (viral)     Discharge Instructions      This looks like a sore throat from a cold virus We are doing COVID testing just to be safe For your sore throat you can use salt water gargles, or take Tylenol Do not take the Augmentin or the prednisone at this time I expect to see improvement over the next few days   ED Prescriptions   None    PDMP not reviewed this encounter.   Eustace Moore, MD 03/03/21 [phone]

## 2021-03-05 LAB — SARS-COV-2, NAA 2 DAY TAT

## 2021-03-05 LAB — NOVEL CORONAVIRUS, NAA: SARS-CoV-2, NAA: NOT DETECTED

## 2021-03-17 DIAGNOSIS — I10 Essential (primary) hypertension: Secondary | ICD-10-CM | POA: Diagnosis not present

## 2021-03-17 DIAGNOSIS — M1611 Unilateral primary osteoarthritis, right hip: Secondary | ICD-10-CM | POA: Diagnosis not present

## 2021-03-17 DIAGNOSIS — R52 Pain, unspecified: Secondary | ICD-10-CM | POA: Diagnosis not present

## 2021-03-17 DIAGNOSIS — M76891 Other specified enthesopathies of right lower limb, excluding foot: Secondary | ICD-10-CM | POA: Diagnosis not present

## 2021-04-02 DIAGNOSIS — R200 Anesthesia of skin: Secondary | ICD-10-CM | POA: Diagnosis not present

## 2021-04-02 DIAGNOSIS — I10 Essential (primary) hypertension: Secondary | ICD-10-CM | POA: Diagnosis not present

## 2021-04-02 DIAGNOSIS — R29898 Other symptoms and signs involving the musculoskeletal system: Secondary | ICD-10-CM | POA: Diagnosis not present

## 2021-04-02 DIAGNOSIS — R202 Paresthesia of skin: Secondary | ICD-10-CM | POA: Diagnosis not present

## 2024-07-02 ENCOUNTER — Ambulatory Visit
Admission: EM | Admit: 2024-07-02 | Discharge: 2024-07-02 | Disposition: A | Discharge status: Home / Self Care | Attending: Family Medicine | Admitting: Family Medicine

## 2024-07-02 DIAGNOSIS — J029 Acute pharyngitis, unspecified: Secondary | ICD-10-CM | POA: Diagnosis not present

## 2024-07-02 LAB — POCT RAPID STREP A (OFFICE): Rapid Strep A Screen: NEGATIVE

## 2024-07-02 MED ORDER — CEPHALEXIN 500 MG PO CAPS: 500.0000 mg | ORAL_CAPSULE | Freq: Two times a day (BID) | ORAL | 0 refills | Status: AC

## 2024-07-02 NOTE — ED Triage Notes (Signed)
 Pt presents with complaints of a sore throat and hoarseness. Pt had surgery on Friday in her lower back. Reports she has had this pain ever since. Denies other symptoms.

## 2024-07-02 NOTE — ED Provider Notes (Signed)
 [facility]    CSN: [id number] Arrival date & time: 07/02/24  1245      History   Chief Complaint No chief complaint on file.   HPI Teresa Brewer is a 73 y.o. female.   Patient had a surgical procedure performed on Friday.  She had general anesthesia.  She states that when she woke up she had a lot of coughing and throat pain.  She states that her throat has been painful ever since then.  Hurts when she swallows.  No fever or chills.  No runny nose or cough. Her back pain is improved    Past Medical History:  Diagnosis Date   Anxiety    Depression    Depression with anxiety    Elevated cholesterol    Hypertension    treated w/ beta blocker   MVP (mitral valve prolapse)     There are no active problems to display for this patient.   Past Surgical History:  Procedure Laterality Date   BLADDER SUSPENSION      OB History     Gravida  0   Para  0   Term  0   Preterm  0   AB  0   Living         SAB  0   IAB  0   Ectopic  0   Multiple      Live Births               Home Medications    Prior to Admission medications   Medication Sig Start Date End Date Taking? Authorizing Provider  cephALEXin (KEFLEX) 500 MG capsule Take 1 capsule (500 mg total) by mouth 2 (two) times daily. 07/02/24  Yes Maranda Jamee Jacob, MD  losartan (COZAAR) 25 MG tablet Take 25 mg by mouth daily.   Yes [provider]  rosuvastatin (CRESTOR) 5 MG tablet Take 5 mg by mouth daily.   Yes [provider]  venlafaxine (EFFEXOR) 25 MG tablet Take 25 mg by mouth 2 (two) times daily.   Yes [provider]  clonazePAM (KLONOPIN) 1 MG tablet Take 1 mg by mouth 2 (two) times daily.    [provider]    Family History History reviewed. No pertinent family history.  Social History Social History   Tobacco Use   Smoking status: Never   Smokeless tobacco: Never  Substance Use Topics   Alcohol use: No   Drug use: No      Allergies   Patient has no known allergies.   Review of Systems Review of Systems See HPI  Physical Exam Triage Vital Signs ED Triage Vitals  Encounter Vitals Group     BP 07/02/24 1315 134/81     Girls Systolic BP Percentile --      Girls Diastolic BP Percentile --      Boys Systolic BP Percentile --      Boys Diastolic BP Percentile --      Pulse Rate 07/02/24 1315 70     Resp 07/02/24 1315 18     Temp 07/02/24 1315 98.5 F (36.9 C)     Temp src --      SpO2 07/02/24 1315 96 %     Weight --      Height --      Head Circumference --      Peak Flow --      Pain Score 07/02/24 1313 8     Pain Loc --  Pain Education --      Exclude from Growth Chart --    No data found.  Updated Vital Signs BP 134/81   Pulse 70   Temp 98.5 F (36.9 C)   Resp 18   SpO2 96%    Physical Exam Constitutional:      General: She is not in acute distress.    Appearance: Normal appearance. She is well-developed.  HENT:     Head: Normocephalic and atraumatic.     Right Ear: Tympanic membrane normal.     Left Ear: Tympanic membrane normal.     Nose: No congestion.     Mouth/Throat:     Mouth: Mucous membranes are moist.     Pharynx: Posterior oropharyngeal erythema present.  Eyes:     Conjunctiva/sclera: Conjunctivae normal.     Pupils: Pupils are equal, round, and reactive to light.  Cardiovascular:     Rate and Rhythm: Normal rate.  Pulmonary:     Effort: Pulmonary effort is normal. No respiratory distress.  Musculoskeletal:        General: Normal range of motion.     Cervical back: Normal range of motion.  Lymphadenopathy:     Cervical: No cervical adenopathy.  Skin:    General: Skin is warm and dry.  Neurological:     Mental Status: She is alert.      UC Treatments / Results  Labs (all labs ordered are listed, but only abnormal results are displayed) Labs Reviewed  CULTURE, GROUP A STREP [facility])  POCT RAPID STREP A (OFFICE)    EKG   Radiology No  results found.  Procedures Procedures (including critical care time)  Medications Ordered in UC Medications - No data to display  Initial Impression / Assessment and Plan / UC Course  I have reviewed the triage vital signs and the nursing notes.  Pertinent labs & imaging results that were available during my care of the patient were reviewed by me and considered in my medical decision making (see chart for details).     Throat culture performed at patient request.  It is negative.  Sore throat may be viral, but may be from trauma with intubation.  Will give her a trial of antibiotics.  Return as needed Final Clinical Impressions(s) / UC Diagnoses   Final diagnoses:  Sore throat     Discharge Instructions      May take Tylenol  or ibuprofen  for throat pain Salt water gargles may help Take antibiotic 2 times a day.  Take 2 doses today See your doctor if not improving in a few days   ED Prescriptions     Medication Sig Dispense Auth. Provider   cephALEXin (KEFLEX) 500 MG capsule Take 1 capsule (500 mg total) by mouth 2 (two) times daily. 10 capsule Maranda Jamee Jacob, MD      PDMP not reviewed this encounter.   Maranda Jamee Jacob, MD 07/02/24 [phone]

## 2024-07-02 NOTE — Discharge Instructions (Signed)
 May take Tylenol  or ibuprofen  for throat pain Salt water gargles may help Take antibiotic 2 times a day.  Take 2 doses today See your doctor if not improving in a few days
